# Patient Record
Sex: Female | Born: 1960 | Race: White | Hispanic: No | Marital: Married | Smoking: Never smoker
Health system: Southern US, Community
[De-identification: ages and names within clinical notes are randomized; demographics above are authoritative.]

## PROBLEM LIST (undated history)

## (undated) DIAGNOSIS — F419 Anxiety disorder, unspecified: Secondary | ICD-10-CM

## (undated) HISTORY — PX: REPLACEMENT TOTAL KNEE: SUR1224

## (undated) HISTORY — PX: TONSILLECTOMY: SUR1361

---

## 2001-03-06 ENCOUNTER — Encounter: Payer: Self-pay | Admitting: Obstetrics and Gynecology

## 2001-03-06 ENCOUNTER — Ambulatory Visit (HOSPITAL_COMMUNITY): Admission: RE | Admit: 2001-03-06 | Discharge: 2001-03-06 | Payer: Self-pay | Admitting: Obstetrics and Gynecology

## 2014-07-21 ENCOUNTER — Encounter (HOSPITAL_BASED_OUTPATIENT_CLINIC_OR_DEPARTMENT_OTHER): Payer: Self-pay

## 2014-07-21 ENCOUNTER — Emergency Department (HOSPITAL_BASED_OUTPATIENT_CLINIC_OR_DEPARTMENT_OTHER)
Admission: EM | Admit: 2014-07-21 | Discharge: 2014-07-22 | Disposition: A | Discharge status: Home / Self Care | Payer: BLUE CROSS/BLUE SHIELD | Attending: Emergency Medicine | Admitting: Emergency Medicine

## 2014-07-21 ENCOUNTER — Emergency Department (HOSPITAL_BASED_OUTPATIENT_CLINIC_OR_DEPARTMENT_OTHER): Payer: BLUE CROSS/BLUE SHIELD

## 2014-07-21 DIAGNOSIS — R51 Headache: Secondary | ICD-10-CM | POA: Diagnosis not present

## 2014-07-21 DIAGNOSIS — R0789 Other chest pain: Secondary | ICD-10-CM | POA: Diagnosis not present

## 2014-07-21 DIAGNOSIS — Z79899 Other long term (current) drug therapy: Secondary | ICD-10-CM | POA: Insufficient documentation

## 2014-07-21 DIAGNOSIS — F419 Anxiety disorder, unspecified: Secondary | ICD-10-CM | POA: Insufficient documentation

## 2014-07-21 DIAGNOSIS — R519 Headache, unspecified: Secondary | ICD-10-CM

## 2014-07-21 DIAGNOSIS — E876 Hypokalemia: Secondary | ICD-10-CM | POA: Diagnosis not present

## 2014-07-21 HISTORY — DX: Anxiety disorder, unspecified: F41.9

## 2014-07-21 LAB — CBC WITH DIFFERENTIAL/PLATELET
Basophils Absolute: 0.1 10*3/uL (ref 0.0–0.1)
Basophils Relative: 1 % (ref 0–1)
EOS ABS: 0.2 10*3/uL (ref 0.0–0.7)
Eosinophils Relative: 2 % (ref 0–5)
HCT: 40.5 % (ref 36.0–46.0)
Hemoglobin: 13.5 g/dL (ref 12.0–15.0)
LYMPHS ABS: 2.2 10*3/uL (ref 0.7–4.0)
Lymphocytes Relative: 31 % (ref 12–46)
MCH: 32.3 pg (ref 26.0–34.0)
MCHC: 33.3 g/dL (ref 30.0–36.0)
MCV: 96.9 fL (ref 78.0–100.0)
MONO ABS: 0.7 10*3/uL (ref 0.1–1.0)
Monocytes Relative: 10 % (ref 3–12)
Neutro Abs: 4.1 10*3/uL (ref 1.7–7.7)
Neutrophils Relative %: 56 % (ref 43–77)
Platelets: 233 10*3/uL (ref 150–400)
RBC: 4.18 MIL/uL (ref 3.87–5.11)
RDW: 12.2 % (ref 11.5–15.5)
WBC: 7.2 10*3/uL (ref 4.0–10.5)

## 2014-07-21 LAB — BASIC METABOLIC PANEL
ANION GAP: 6 (ref 5–15)
BUN: 12 mg/dL (ref 6–23)
CALCIUM: 9.2 mg/dL (ref 8.4–10.5)
CHLORIDE: 103 mmol/L (ref 96–112)
CO2: 27 mmol/L (ref 19–32)
Creatinine, Ser: 0.44 mg/dL — ABNORMAL LOW (ref 0.50–1.10)
GFR calc Af Amer: >90 mL/min (ref >90)
GLUCOSE: 109 mg/dL — AB (ref 70–99)
Potassium: 3.1 mmol/L — ABNORMAL LOW (ref 3.5–5.1)
Sodium: 136 mmol/L (ref 135–145)

## 2014-07-21 LAB — TROPONIN I: Troponin I: <0.03 ng/mL (ref <0.031)

## 2014-07-21 LAB — D-DIMER, QUANTITATIVE: D-Dimer, Quant: <0.27 ug/mL-FEU (ref 0.00–0.48)

## 2014-07-21 MED ORDER — KETOROLAC TROMETHAMINE 30 MG/ML IJ SOLN
30.0000 mg | Freq: Once | INTRAMUSCULAR | Status: AC
  Administered 2014-07-21: 30 mg via INTRAVENOUS
  Filled 2014-07-21: qty 1

## 2014-07-21 MED ORDER — SODIUM CHLORIDE 0.9 % IV BOLUS (SEPSIS)
1000.0000 mL | Freq: Once | INTRAVENOUS | Status: AC
  Administered 2014-07-21: 1000 mL via INTRAVENOUS

## 2014-07-21 MED ORDER — METOCLOPRAMIDE HCL 5 MG/ML IJ SOLN
10.0000 mg | Freq: Once | INTRAMUSCULAR | Status: AC
  Administered 2014-07-21: 10 mg via INTRAVENOUS
  Filled 2014-07-21: qty 2

## 2014-07-21 NOTE — ED Provider Notes (Signed)
CSN: [id number]     Arrival date & time 07/21/14  2057 History   First MD Initiated Contact with Patient 07/21/14 2130     Chief Complaint  Patient presents with  . Headache     (Consider location/radiation/quality/duration/timing/severity/associated sxs/prior Treatment) HPI  Regina Osborn is a [age] y.o. female with PMH of anxiety, HTN presenting with an on and off headache that she fears is related to high blood pressure as well as chest discomfort that is described as a tightness in her mid chest. This started at 11am this morning. She has this 3-4 times weekly and it is not worse with deep breathing, eating, exertion. Patient does endorse some shortness of breath associated with it. Mild nausea but no diaphoresis or vomiting. Patient has had no cardiac history and has been evaluated by a cardiologist. Patient denies history of diabetes, high cholesterol levels, satting cardiac death in the family. Patient without history of DVT, PE, malignancy, recent surgeries or trauma. No estrogen use. Patient with headache that developed gradually and is like other headaches he's had. Headache is throbbing and frontal. Worse at the end of the day. Worse with stress. Patient reports quite a bit of stress at work and at home.   Past Medical History  Diagnosis Date  . Anxiety    Past Surgical History  Procedure Laterality Date  . Tonsillectomy    . Replacement total knee     No family history on file. History  Substance Use Topics  . Smoking status: Never Smoker   . Smokeless tobacco: Not on file  . Alcohol Use: Yes     Comment: soical    OB History    No data available     Review of Systems 10 Systems reviewed and are negative for acute change except as noted in the HPI.    Allergies  Review of patient's allergies indicates no known allergies.  Home Medications   Prior to Admission medications   Medication Sig Start Date End Date Taking? Authorizing Provider  progesterone  (PROMETRIUM) 100 MG capsule Take 100 mg by mouth daily.   Yes Historical Provider, MD  venlafaxine (EFFEXOR) 25 MG tablet Take 25 mg by mouth 2 (two) times daily.   Yes Historical Provider, MD   BP 131/81 mmHg  Pulse 89  Temp(Src) 98.5 F (36.9 C) (Oral)  Resp 16  Ht 5\' 3"  (1.6 m)  Wt 117 lb (53.071 kg)  BMI 20.73 kg/m2  SpO2 99% Physical Exam  Constitutional: She appears well-developed and well-nourished. No distress.  HENT:  Head: Normocephalic and atraumatic.  Mouth/Throat: Oropharynx is clear and moist.  Eyes: Conjunctivae and EOM are normal. Pupils are equal, round, and reactive to light. Right eye exhibits no discharge. Left eye exhibits no discharge.  Neck: Normal range of motion. Neck supple. No JVD present.  No nuchal rigidity  Cardiovascular: Normal rate and regular rhythm.   No leg swelling or tenderness. Negative Homan's sign.  Pulmonary/Chest: Effort normal and breath sounds normal. No respiratory distress. She has no wheezes.  Abdominal: Soft. Bowel sounds are normal. She exhibits no distension. There is no tenderness.  Neurological: She is alert. No cranial nerve deficit. Coordination normal.  Speech is clear and goal oriented. Peripheral visual fields intact. Strength 5/5 in upper and lower extremities. Sensation intact. Intact rapid alternating movements, finger to nose, and heel to shin. Negative Romberg. No pronator drift. Normal gait.   Skin: Skin is warm and dry. She is not diaphoretic.  Nursing note  and vitals reviewed.   ED Course  Procedures (including critical care time) Labs Review Labs Reviewed  BASIC METABOLIC PANEL - Abnormal; Notable for the following:    Potassium 3.1 (*)    Glucose, Bld 109 (*)    Creatinine, Ser 0.44 (*)    All other components within normal limits  CBC WITH DIFFERENTIAL/PLATELET  TROPONIN I  D-DIMER, QUANTITATIVE    Imaging Review Dg Chest 2 View  07/21/2014   CLINICAL DATA:  Acute onset of headache, nausea and  shortness of breath. Initial encounter.  EXAM: CHEST  2 VIEW  COMPARISON:  None.  FINDINGS: The lungs are well-aerated and clear. There is no evidence of focal opacification, pleural effusion or pneumothorax.  The heart is normal in size; the mediastinal contour is within normal limits. No acute osseous abnormalities are seen.  IMPRESSION: No acute cardiopulmonary process seen.   Electronically Signed   By: Roanna Raider M.D.   On: 07/21/2014 23:24     EKG Interpretation   Date/Time:  Monday July 21 2014 23:52:35 EST Ventricular Rate:  79 PR Interval:  172 QRS Duration: 76 QT Interval:  418 QTC Calculation: 479 R Axis:   61 Text Interpretation:  Normal sinus rhythm Confirmed by [facility]  MD,  APRIL ([id number]) on 07/21/2014 11:53:41 PM      MDM   Final diagnoses:  Atypical chest pain  Acute nonintractable headache, unspecified headache type  Hypokalemia   Chest pain is not likely of cardiac or pulmonary etiology d/t presentation, low risk Well's with negative D-dimer, low risk HEART score. VSS, no JVD or new murmur, RRR, breath sounds equal bilaterally, no peripheral edema, EKG without acute abnormalities, negative troponin with over 8 hours of constant pain, and negative CXR. Patient's chest pain occurs 2-3 times per week and is affiliated with stressors. Patient found to have hyperkalemia which was treated orally in ED. Pt has been advised to return to the ED if CP becomes exertional, associated with diaphoresis or nausea, radiates to left jaw/arm, worsens or becomes concerning in any way. Follow-up with PCP. Patient with headache as well. Is like other headaches she's had before been gradual in onset. Improved while in ED. Normal neurological exam. I doubt subarachnoid, intracranial hemorrhage, meningitis. Pt appears reliable for follow up and is agreeable to discharge.   Discussed return precautions with patient. Discussed all results and patient verbalizes understanding and agrees  with plan.  This is a shared patient. This patient was discussed with the physician who saw and evaluated the patient and agrees with the plan.    Louann Sjogren, PA-C 07/22/14 0044  Richardean Canal, MD 07/24/14 [phone]

## 2014-07-21 NOTE — ED Notes (Signed)
Pt states took a valerian root 1hr ago with some relief

## 2014-07-21 NOTE — ED Notes (Signed)
Pt states battling high b/p for past 6months; states having headaches and nausea for past 5days, states her father had a stroke in his sleep so wanted her pressure checked.

## 2014-07-22 MED ORDER — POTASSIUM CHLORIDE CRYS ER 20 MEQ PO TBCR
40.0000 meq | EXTENDED_RELEASE_TABLET | Freq: Once | ORAL | Status: AC
  Administered 2014-07-22: 40 meq via ORAL
  Filled 2014-07-22: qty 2

## 2014-07-22 NOTE — Discharge Instructions (Signed)
Return to the emergency room with worsening of symptoms, new symptoms or with symptoms that are concerning , especially chest pain that feels like a pressure, spreads to left arm or jaw, worse with exertion, associated with nausea, vomiting, shortness of breath and/or sweating OR severe worsening of headache, visual or speech changes, weakness in face, arms or legs.  Please call your doctor for a followup appointment within 24-48 hours. When you talk to your doctor please let them know that you were seen in the emergency department and have them acquire all of your records so that they can discuss the findings with you and formulate a treatment plan to fully care for your new and ongoing problems.  Chest Pain (Nonspecific) It is often hard to give a specific diagnosis for the cause of chest pain. There is always a chance that your pain could be related to something serious, such as a heart attack or a blood clot in the lungs. You need to follow up with your health care provider for further evaluation. CAUSES   Heartburn.  Pneumonia or bronchitis.  Anxiety or stress.  Inflammation around your heart (pericarditis) or lung (pleuritis or pleurisy).  A blood clot in the lung.  A collapsed lung (pneumothorax). It can develop suddenly on its own (spontaneous pneumothorax) or from trauma to the chest.  Shingles infection (herpes zoster virus). The chest wall is composed of bones, muscles, and cartilage. Any of these can be the source of the pain.  The bones can be bruised by injury.  The muscles or cartilage can be strained by coughing or overwork.  The cartilage can be affected by inflammation and become sore (costochondritis). DIAGNOSIS  Lab tests or other studies may be needed to find the cause of your pain. Your health care provider may have you take a test called an ambulatory electrocardiogram (ECG). An ECG records your heartbeat patterns over a 24-hour period. You may also have other  tests, such as:  Transthoracic echocardiogram (TTE). During echocardiography, sound waves are used to evaluate how blood flows through your heart.  Transesophageal echocardiogram (TEE).  Cardiac monitoring. This allows your health care provider to monitor your heart rate and rhythm in real time.  Holter monitor. This is a portable device that records your heartbeat and can help diagnose heart arrhythmias. It allows your health care provider to track your heart activity for several days, if needed.  Stress tests by exercise or by giving medicine that makes the heart beat faster. TREATMENT   Treatment depends on what may be causing your chest pain. Treatment may include:  Acid blockers for heartburn.  Anti-inflammatory medicine.  Pain medicine for inflammatory conditions.  Antibiotics if an infection is present.  You may be advised to change lifestyle habits. This includes stopping smoking and avoiding alcohol, caffeine, and chocolate.  You may be advised to keep your head raised (elevated) when sleeping. This reduces the chance of acid going backward from your stomach into your esophagus. Most of the time, nonspecific chest pain will improve within 2-3 days with rest and mild pain medicine.  HOME CARE INSTRUCTIONS   If antibiotics were prescribed, take them as directed. Finish them even if you start to feel better.  For the next few days, avoid physical activities that bring on chest pain. Continue physical activities as directed.  Do not use any tobacco products, including cigarettes, chewing tobacco, or electronic cigarettes.  Avoid drinking alcohol.  Only take medicine as directed by your health care provider.  Follow your health care provider's suggestions for further testing if your chest pain does not go away.  Keep any follow-up appointments you made. If you do not go to an appointment, you could develop lasting (chronic) problems with pain. If there is any problem  keeping an appointment, call to reschedule. SEEK MEDICAL CARE IF:   Your chest pain does not go away, even after treatment.  You have a rash with blisters on your chest.  You have a fever. SEEK IMMEDIATE MEDICAL CARE IF:   You have increased chest pain or pain that spreads to your arm, neck, jaw, back, or abdomen.  You have shortness of breath.  You have an increasing cough, or you cough up blood.  You have severe back or abdominal pain.  You feel nauseous or vomit.  You have severe weakness.  You faint.  You have chills. This is an emergency. Do not wait to see if the pain will go away. Get medical help at once. Call your local emergency services (911 in [place] Do not drive yourself to the hospital. MAKE SURE YOU:   Understand these instructions.  Will watch your condition.  Will get help right away if you are not doing well or get worse. Document Released: 03/23/2005 Document Revised: 06/18/2013 Document Reviewed: 01/17/2008 [facility] Patient Information 2015 [place], [place]. This information is not intended to replace advice given to you by your health care provider. Make sure you discuss any questions you have with your health care provider.  General Headache Without Cause A headache is pain or discomfort felt around the head or neck area. The specific cause of a headache may not be found. There are many causes and types of headaches. A few common ones are:  Tension headaches.  Migraine headaches.  Cluster headaches.  Chronic daily headaches. HOME CARE INSTRUCTIONS   Keep all follow-up appointments with your caregiver or any specialist referral.  Only take over-the-counter or prescription medicines for pain or discomfort as directed by your caregiver.  Lie down in a dark, quiet room when you have a headache.  Keep a headache journal to find out what may trigger your migraine headaches. For example, write down:  What you eat and drink.  How much sleep you  get.  Any change to your diet or medicines.  Try massage or other relaxation techniques.  Put ice packs or heat on the head and neck. Use these 3 to 4 times per day for 15 to 20 minutes each time, or as needed.  Limit stress.  Sit up straight, and do not tense your muscles.  Quit smoking if you smoke.  Limit alcohol use.  Decrease the amount of caffeine you drink, or stop drinking caffeine.  Eat and sleep on a regular schedule.  Get 7 to 9 hours of sleep, or as recommended by your caregiver.  Keep lights dim if bright lights bother you and make your headaches worse. SEEK MEDICAL CARE IF:   You have problems with the medicines you were prescribed.  Your medicines are not working.  You have a change from the usual headache.  You have nausea or vomiting. SEEK IMMEDIATE MEDICAL CARE IF:   Your headache becomes severe.  You have a fever.  You have a stiff neck.  You have loss of vision.  You have muscular weakness or loss of muscle control.  You start losing your balance or have trouble walking.  You feel faint or pass out.  You have severe symptoms that are  different from your first symptoms. MAKE SURE YOU:   Understand these instructions.  Will watch your condition.  Will get help right away if you are not doing well or get worse. Document Released: 06/13/2005 Document Revised: 09/05/2011 Document Reviewed: 06/29/2011 [facility] Patient Information 2015 [place], [place]. This information is not intended to replace advice given to you by your health care provider. Make sure you discuss any questions you have with your health care provider.

## 2014-07-22 NOTE — ED Notes (Signed)
PA at bedside.

## 2016-02-25 IMAGING — CR DG CHEST 2V
2 series · 2 of 2 positions shown · non-contrast
Comparison: None.

CLINICAL DATA: Acute onset of headache, nausea and shortness of
breath. Initial encounter.

EXAM:
CHEST  2 VIEW

[w chest pa]
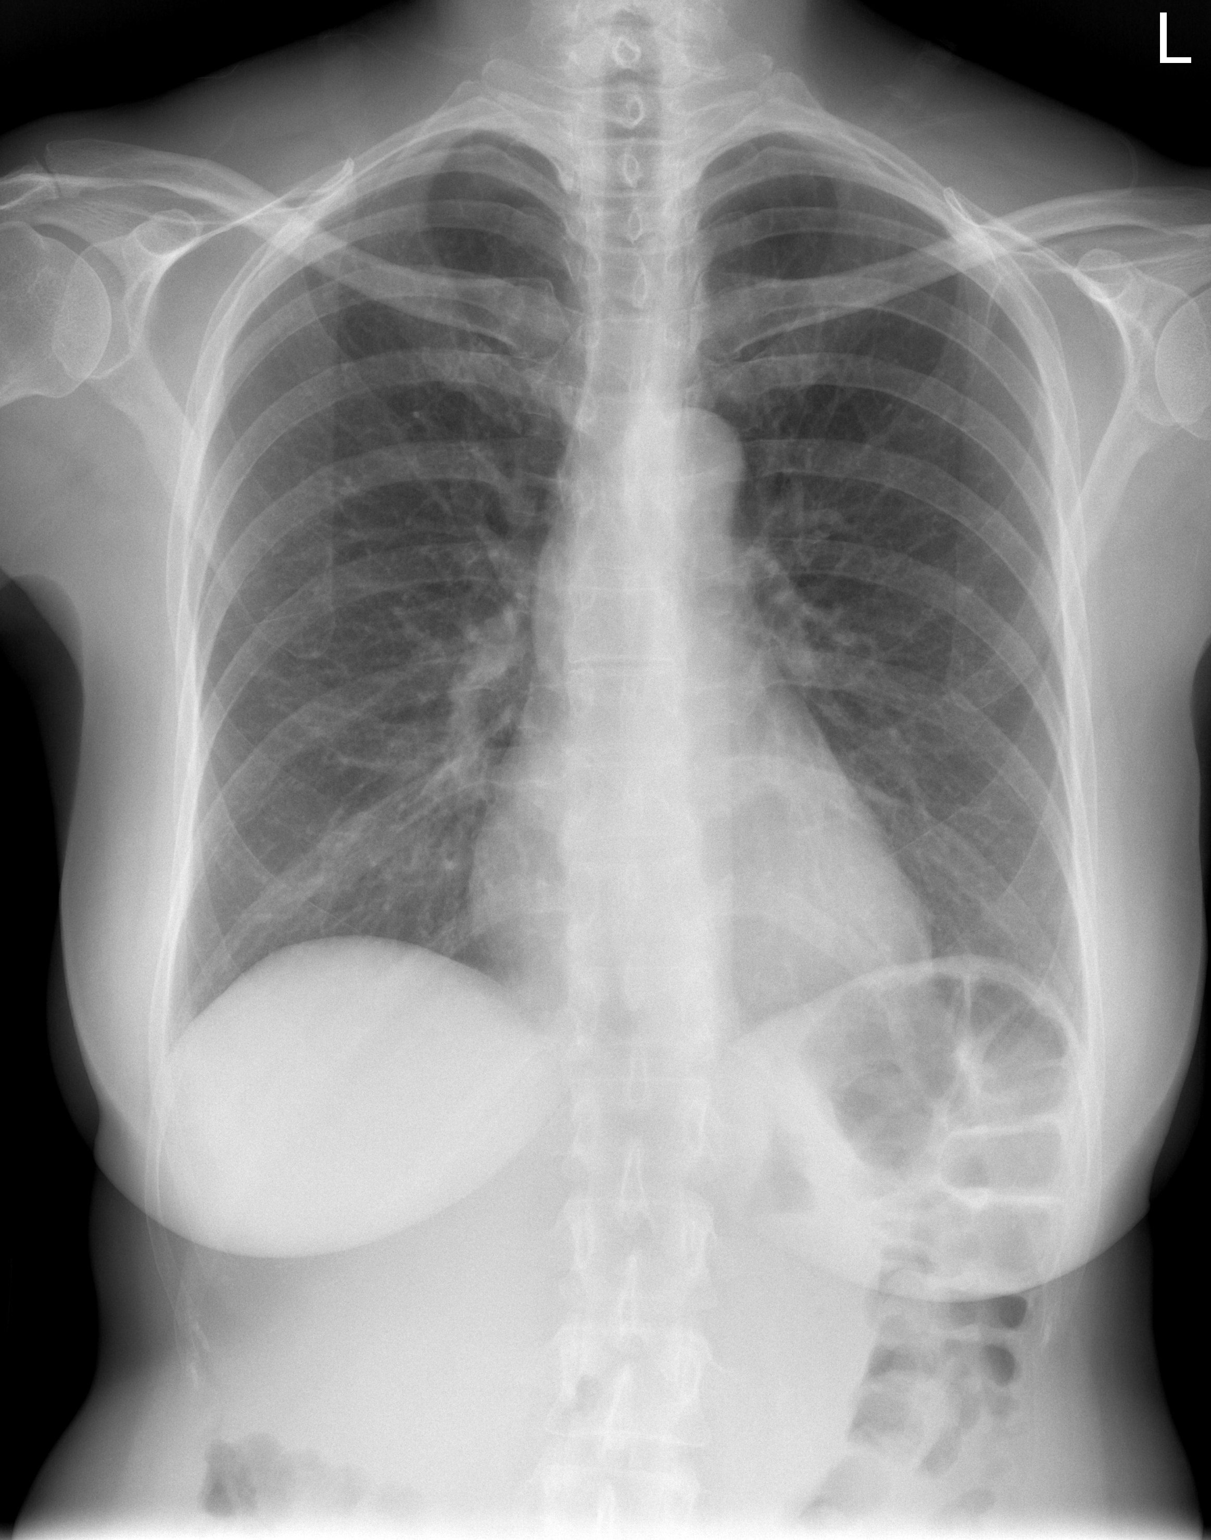

[w chest lat]
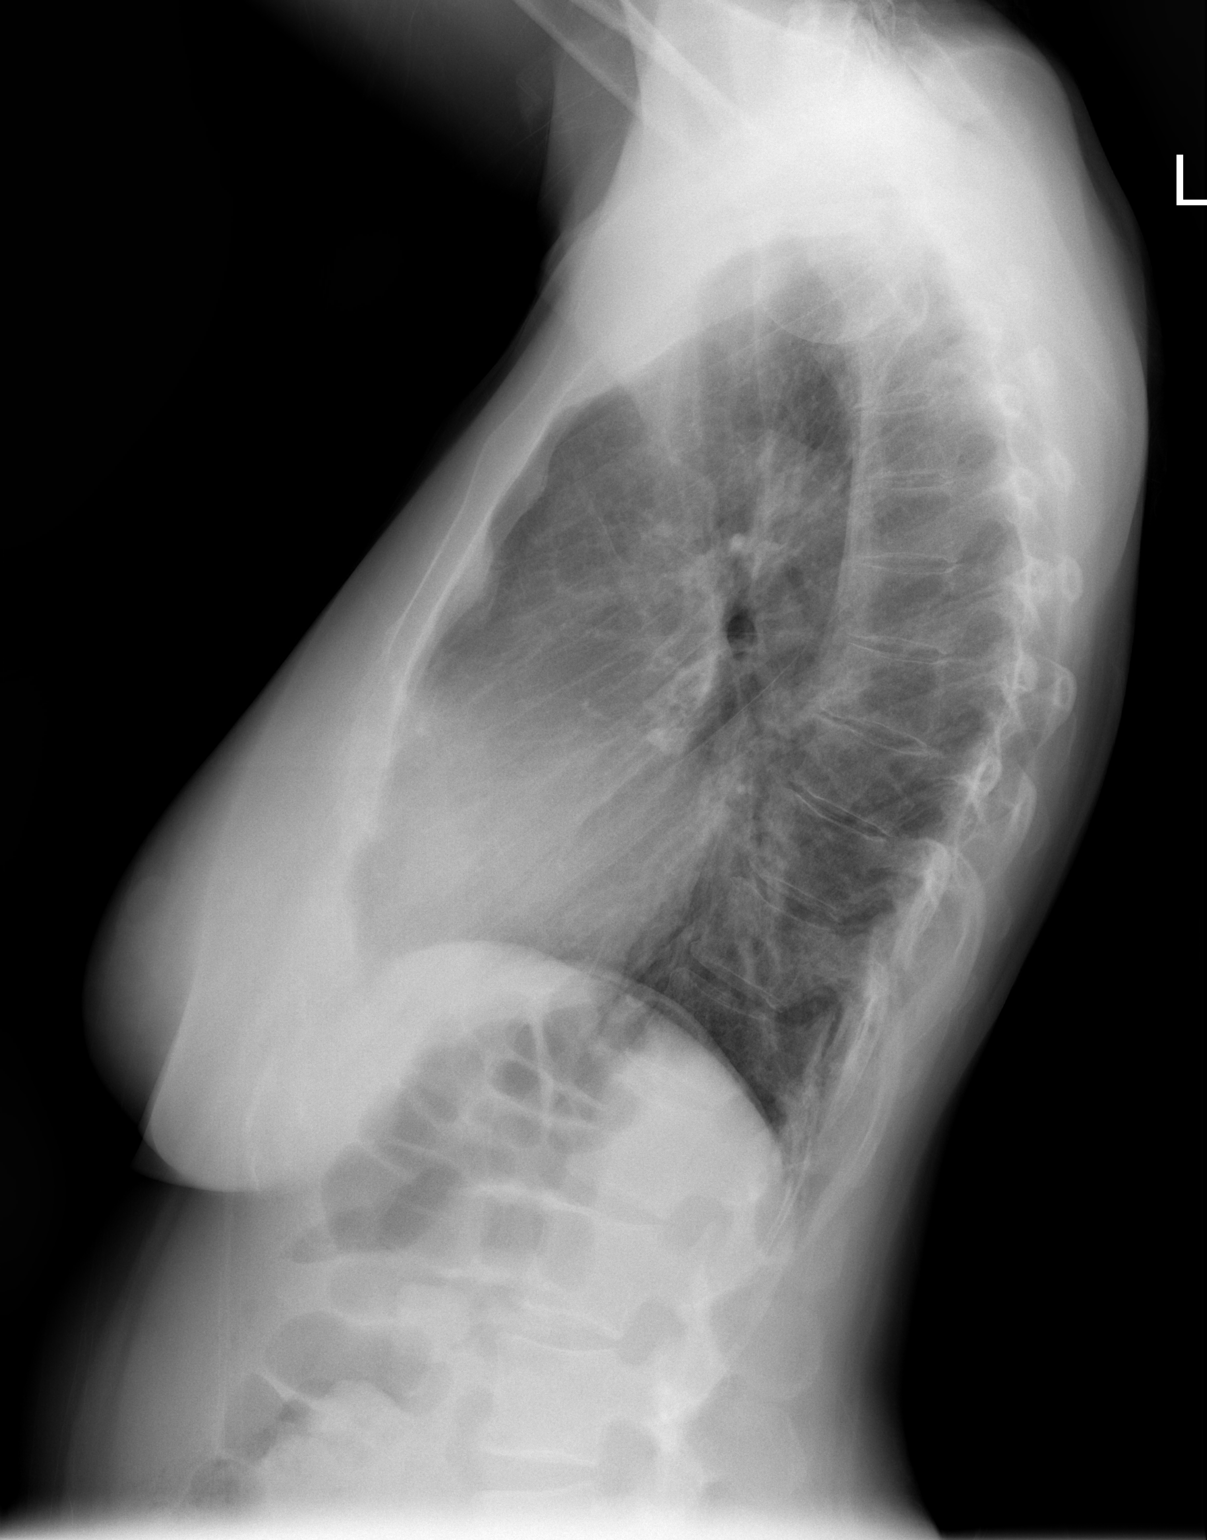

[2 of 2 positions shown; findings below may reference images not displayed]

FINDINGS: The lungs are well-aerated and clear. There is no evidence of focal
opacification, pleural effusion or pneumothorax.

The heart is normal in size; the mediastinal contour is within
normal limits. No acute osseous abnormalities are seen.
IMPRESSION: No acute cardiopulmonary process seen.

## 2017-10-28 ENCOUNTER — Encounter (HOSPITAL_BASED_OUTPATIENT_CLINIC_OR_DEPARTMENT_OTHER): Payer: Self-pay | Admitting: Emergency Medicine

## 2017-10-28 ENCOUNTER — Other Ambulatory Visit: Payer: Self-pay

## 2017-10-28 ENCOUNTER — Emergency Department (HOSPITAL_BASED_OUTPATIENT_CLINIC_OR_DEPARTMENT_OTHER)
Admission: EM | Admit: 2017-10-28 | Discharge: 2017-10-28 | Disposition: A | Discharge status: Home / Self Care | Payer: BLUE CROSS/BLUE SHIELD | Attending: Emergency Medicine | Admitting: Emergency Medicine

## 2017-10-28 DIAGNOSIS — R300 Dysuria: Secondary | ICD-10-CM | POA: Diagnosis present

## 2017-10-28 DIAGNOSIS — N12 Tubulo-interstitial nephritis, not specified as acute or chronic: Secondary | ICD-10-CM | POA: Insufficient documentation

## 2017-10-28 DIAGNOSIS — Z96659 Presence of unspecified artificial knee joint: Secondary | ICD-10-CM | POA: Insufficient documentation

## 2017-10-28 DIAGNOSIS — Z79899 Other long term (current) drug therapy: Secondary | ICD-10-CM | POA: Diagnosis not present

## 2017-10-28 LAB — CBC WITH DIFFERENTIAL/PLATELET
Basophils Absolute: 0 10*3/uL (ref 0.0–0.1)
Basophils Relative: 0 %
Eosinophils Absolute: 0 10*3/uL (ref 0.0–0.7)
Eosinophils Relative: 0 %
HEMATOCRIT: 37.6 % (ref 36.0–46.0)
Hemoglobin: 13.1 g/dL (ref 12.0–15.0)
Lymphocytes Relative: 5 %
Lymphs Abs: 0.6 10*3/uL — ABNORMAL LOW (ref 0.7–4.0)
MCH: 33 pg (ref 26.0–34.0)
MCHC: 34.8 g/dL (ref 30.0–36.0)
MCV: 94.7 fL (ref 78.0–100.0)
MONOS PCT: 11 %
Monocytes Absolute: 1.3 10*3/uL — ABNORMAL HIGH (ref 0.1–1.0)
NEUTROS ABS: 10.7 10*3/uL — AB (ref 1.7–7.7)
Neutrophils Relative %: 84 %
PLATELETS: 194 10*3/uL (ref 150–400)
RBC: 3.97 MIL/uL (ref 3.87–5.11)
RDW: 12.8 % (ref 11.5–15.5)
WBC: 12.7 10*3/uL — ABNORMAL HIGH (ref 4.0–10.5)

## 2017-10-28 LAB — URINALYSIS, MICROSCOPIC (REFLEX)

## 2017-10-28 LAB — URINALYSIS, ROUTINE W REFLEX MICROSCOPIC
Bilirubin Urine: NEGATIVE
GLUCOSE, UA: NEGATIVE mg/dL
Ketones, ur: 15 mg/dL — AB
Leukocytes, UA: NEGATIVE
Nitrite: NEGATIVE
PH: 6.5 (ref 5.0–8.0)
Protein, ur: NEGATIVE mg/dL

## 2017-10-28 LAB — COMPREHENSIVE METABOLIC PANEL
ALBUMIN: 3.8 g/dL (ref 3.5–5.0)
ALT: 16 U/L (ref 14–54)
AST: 19 U/L (ref 15–41)
Alkaline Phosphatase: 56 U/L (ref 38–126)
Anion gap: 11 (ref 5–15)
BUN: 8 mg/dL (ref 6–20)
CHLORIDE: 104 mmol/L (ref 101–111)
CO2: 20 mmol/L — AB (ref 22–32)
Calcium: 8.5 mg/dL — ABNORMAL LOW (ref 8.9–10.3)
Creatinine, Ser: 0.49 mg/dL (ref 0.44–1.00)
GFR calc Af Amer: >60 mL/min (ref >60)
GLUCOSE: 141 mg/dL — AB (ref 65–99)
POTASSIUM: 3.1 mmol/L — AB (ref 3.5–5.1)
SODIUM: 135 mmol/L (ref 135–145)
Total Bilirubin: 0.9 mg/dL (ref 0.3–1.2)
Total Protein: 6.7 g/dL (ref 6.5–8.1)

## 2017-10-28 MED ORDER — SODIUM CHLORIDE 0.9 % IV BOLUS
1000.0000 mL | Freq: Once | INTRAVENOUS | Status: AC
  Administered 2017-10-28: 1000 mL via INTRAVENOUS

## 2017-10-28 MED ORDER — ACETAMINOPHEN 325 MG PO TABS
650.0000 mg | ORAL_TABLET | Freq: Once | ORAL | Status: AC
  Administered 2017-10-28: 650 mg via ORAL
  Filled 2017-10-28: qty 2

## 2017-10-28 MED ORDER — CEPHALEXIN 500 MG PO CAPS: 500.0000 mg | ORAL_CAPSULE | Freq: Four times a day (QID) | ORAL | 0 refills | Status: DC

## 2017-10-28 MED ORDER — SODIUM CHLORIDE 0.9 % IV SOLN
1.0000 g | Freq: Once | INTRAVENOUS | Status: AC
  Administered 2017-10-28: 1 g via INTRAVENOUS
  Filled 2017-10-28: qty 10

## 2017-10-28 MED ORDER — ONDANSETRON HCL 4 MG/2ML IJ SOLN
4.0000 mg | Freq: Once | INTRAMUSCULAR | Status: AC
  Administered 2017-10-28: 4 mg via INTRAVENOUS
  Filled 2017-10-28: qty 2

## 2017-10-28 NOTE — ED Provider Notes (Signed)
[facility] EMERGENCY DEPARTMENT Provider Note   CSN: [id number] Arrival date & time: 10/28/17  1037     History   Chief Complaint Chief Complaint  Patient presents with  . Back Pain    HPI Regina Osborn is a 57 y.o. female.  Patient is a 57 year old female with no significant medical problems who is presenting today with persistent dysuria, now fever, nausea and back pain starting in the last 2 days.  Patient states she has had dysuria for the last 6 days.  She saw her doctor on Thursday and based on her symptoms she was started on Macrobid.  However that night she developed fever and back pain.  That is been persistent as well as body aches.  She spoke with her doctor today who recommended she come here for further evaluation.  The history is provided by the patient.  Dysuria   This is a new problem. Episode onset: 6 days ago. The problem occurs every urination. The problem has been gradually worsening. The quality of the pain is described as burning, shooting and aching. The pain is at a severity of 4/10. The pain is moderate. The maximum temperature recorded prior to her arrival was 101 to 101.9 F. The fever has been present for 1 to 2 days. There is no history of pyelonephritis. Associated symptoms include chills, sweats, nausea, frequency, urgency and flank pain. Pertinent negatives include no vomiting and no discharge. She has tried antibiotics for the symptoms. Her past medical history does not include kidney stones, urological procedure or recurrent UTIs.    Past Medical History:  Diagnosis Date  . Anxiety     There are no active problems to display for this patient.   Past Surgical History:  Procedure Laterality Date  . REPLACEMENT TOTAL KNEE    . TONSILLECTOMY       OB History   None      Home Medications    Prior to Admission medications   Medication Sig Start Date End Date Taking? Authorizing Provider  estradiol (ESTRACE) 1 MG tablet Take 1 mg  by mouth daily.   Yes [provider]  thyroid (ARMOUR) 195 MG tablet Take 195 mg by mouth daily.   Yes [provider]  progesterone (PROMETRIUM) 100 MG capsule Take 100 mg by mouth daily.    [provider]  venlafaxine (EFFEXOR) 25 MG tablet Take 25 mg by mouth 2 (two) times daily.    [provider]    Family History History reviewed. No pertinent family history.  Social History Social History   Tobacco Use  . Smoking status: Never Smoker  . Smokeless tobacco: Never Used  Substance Use Topics  . Alcohol use: Yes    Comment: soical   . Drug use: Not on file     Allergies   Sulfa antibiotics   Review of Systems Review of Systems  Constitutional: Positive for chills.  Gastrointestinal: Positive for nausea. Negative for vomiting.  Genitourinary: Positive for dysuria, flank pain, frequency and urgency.  All other systems reviewed and are negative.    Physical Exam Updated Vital Signs BP (!) 158/84 (BP Location: Right Arm)   Pulse 98   Temp 99.9 F (37.7 C) (Oral)   Resp 18   Ht  (1.6 m)   Wt 56.7 kg (125 lb)   SpO2 100%   BMI 22.14 kg/m   Physical Exam  Constitutional: She is oriented to person, place, and time. She appears well-developed and  well-nourished. No distress.  HENT:  Head: Normocephalic and atraumatic.  Mouth/Throat: Oropharynx is clear and moist. Mucous membranes are dry.  Eyes: Pupils are equal, round, and reactive to light. Conjunctivae and EOM are normal.  Neck: Normal range of motion. Neck supple.  Cardiovascular: Normal rate, regular rhythm and intact distal pulses.  No murmur heard. Pulmonary/Chest: Effort normal and breath sounds normal. No respiratory distress. She has no wheezes. She has no rales.  Abdominal: Soft. She exhibits no distension. There is tenderness in the suprapubic area. There is CVA tenderness. There is no rebound and no guarding.  Musculoskeletal: Normal range of motion. She  exhibits no edema or tenderness.  Neurological: She is alert and oriented to person, place, and time.  Skin: Skin is warm and dry. No rash noted. No erythema.  Psychiatric: She has a normal mood and affect. Her behavior is normal.  Nursing note and vitals reviewed.    ED Treatments / Results  Labs (all labs ordered are listed, but only abnormal results are displayed) Labs Reviewed  CBC WITH DIFFERENTIAL/PLATELET - Abnormal; Notable for the following components:      Result Value   WBC 12.7 (*)    Neutro Abs 10.7 (*)    Lymphs Abs 0.6 (*)    Monocytes Absolute 1.3 (*)    All other components within normal limits  COMPREHENSIVE METABOLIC PANEL - Abnormal; Notable for the following components:   Potassium 3.1 (*)    CO2 20 (*)    Glucose, Bld 141 (*)    Calcium 8.5 (*)    All other components within normal limits  URINALYSIS, ROUTINE W REFLEX MICROSCOPIC - Abnormal; Notable for the following components:   Specific Gravity, Urine <1.005 (*)    Hgb urine dipstick TRACE (*)    Ketones, ur 15 (*)    All other components within normal limits  URINALYSIS, MICROSCOPIC (REFLEX) - Abnormal; Notable for the following components:   Bacteria, UA FEW (*)    All other components within normal limits  URINE CULTURE    EKG None  Radiology No results found.  Procedures Procedures (including critical care time)  Medications Ordered in ED Medications  sodium chloride 0.9 % bolus 1,000 mL (has no administration in time range)  ondansetron (ZOFRAN) injection 4 mg (has no administration in time range)     Initial Impression / Assessment and Plan / ED Course  I have reviewed the triage vital signs and the nursing notes.  Pertinent labs & imaging results that were available during my care of the patient were reviewed by me and considered in my medical decision making (see chart for details).     Patient presenting with symptoms concerning for pyelonephritis.  Patient had UTI type  symptoms 6 days ago and was started on Macrobid 2 days ago but symptoms are progressing.  She is having fever, flank pain, nausea and dysuria.  She does not have frequent UTIs has never had a kidney stone and does not have any type of procedure concerning for resistant organism.  Low suspicion for kidney stone, dissection, vaginal or abdominal pathology at this time.  Patient given IV fluids, Zofran and Tylenol.  CBC, CMP, UA and culture pending  12:34 PM Abs with mild leukocytosis of 12.7, normal hemoglobin, renal function is intact and mild hypokalemia of 3.1.  Patient's urine without significant findings for infection however Macrobid may have cleared some of the UTI however given her history concern for [place].  Patient given a dose of  Rocephin here.  Will be told to stop Macrobid and start Keflex.  Final Clinical Impressions(s) / ED Diagnoses   Final diagnoses:  Pyelonephritis    ED Discharge Orders        Ordered    cephALEXin (KEFLEX) 500 MG capsule  4 times daily     10/28/17 1311       Gwyneth Sprout, MD 10/28/17 1311

## 2017-10-28 NOTE — ED Triage Notes (Signed)
Patient states that she has had back pain for the last 2 days - the patient was dx with UTI and started on Macrobid - patient continues to have increased fever and chills

## 2017-10-28 NOTE — Discharge Instructions (Signed)
Drink plenty of fluids, take Tylenol or ibuprofen for pain and fever.  Take the antibiotic starting tomorrow.

## 2017-10-28 NOTE — ED Notes (Signed)
Pt given Rx x 1 for keflex. Ambulatory to d/c window with steady gait.

## 2017-10-29 LAB — URINE CULTURE: Culture: 10000 — AB

## 2017-10-30 ENCOUNTER — Telehealth: Payer: Self-pay | Admitting: *Deleted

## 2017-10-30 NOTE — Telephone Encounter (Signed)
Post ED Visit - Positive Culture Follow-up  Culture report reviewed by antimicrobial stewardship pharmacist:   Enzo Bi, Pharm.D.  Celedonio Miyamoto, Pharm.D., BCPS AQ-ID  Garvin Fila, Pharm.D., BCPS  Georgina Pillion, Pharm.D., BCPS  [place], [address]D., BCPS, AAHIVP  Estella Husk, Pharm.D., BCPS, AAHIVP  Lysle Pearl, PharmD, BCPS  Sherlynn Carbon, PharmD  Pollyann Samples, PharmD, BCPS Sharin Mons, PharmD  Positive urine culture Treated with Cephalexin, organism sensitive to the same and no further patient follow-up is required at this time.  Virl Axe [facility] 10/30/2017, 10:30 AM

## 2018-05-13 ENCOUNTER — Emergency Department (HOSPITAL_BASED_OUTPATIENT_CLINIC_OR_DEPARTMENT_OTHER)
Admission: EM | Admit: 2018-05-13 | Discharge: 2018-05-13 | Disposition: A | Discharge status: Home / Self Care | Payer: BLUE CROSS/BLUE SHIELD | Attending: Emergency Medicine | Admitting: Emergency Medicine

## 2018-05-13 ENCOUNTER — Other Ambulatory Visit: Payer: Self-pay

## 2018-05-13 ENCOUNTER — Encounter (HOSPITAL_BASED_OUTPATIENT_CLINIC_OR_DEPARTMENT_OTHER): Payer: Self-pay | Admitting: Emergency Medicine

## 2018-05-13 DIAGNOSIS — Z4800 Encounter for change or removal of nonsurgical wound dressing: Secondary | ICD-10-CM

## 2018-05-13 DIAGNOSIS — Z4801 Encounter for change or removal of surgical wound dressing: Secondary | ICD-10-CM | POA: Insufficient documentation

## 2018-05-13 DIAGNOSIS — Z4889 Encounter for other specified surgical aftercare: Secondary | ICD-10-CM

## 2018-05-13 DIAGNOSIS — Z79899 Other long term (current) drug therapy: Secondary | ICD-10-CM | POA: Diagnosis not present

## 2018-05-13 NOTE — Discharge Instructions (Addendum)
Until you are told you can by your surgeon please do not get your wound wet.  Please keep your appointment with them tomorrow.  You may continue to have some bruising and drainage around your wound, however right now it does not look infected.  Please keep an eye on it to make sure it does not become very red.

## 2018-05-13 NOTE — ED Triage Notes (Signed)
Pt reports that she had left knee surgery 10/24. Pt fell on Tuesday, pt seen in ED where she had the surgical site sutured again. Pt reports bleeding through stitches.

## 2018-05-13 NOTE — ED Provider Notes (Addendum)
[facility] EMERGENCY DEPARTMENT Provider Note   CSN: [id number] Arrival date & time: 05/13/18  1039     History   Chief Complaint Chief Complaint  Patient presents with  . Fall  . Post-op Problem    HPI Posey R Werk is a 57 y.o. female who presents today for evaluation of postsurgical wound.  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  On Tuesday she fell and was seen in a different emergency room where her laceration was sutured and she had a dressing placed.  Her fall was mechanical.  She has an appointment tomorrow with her surgeon.  She has been evaluated since the fall, has had an MRI, x-rays, and additional tests obtained.  She denies any injuries other than to her leg from the fall.  No fevers, no nausea vomiting or diarrhea.  Her pain is significantly improved from the fall.  Her only concern today is that she had discharge on her dressing, and wishes for a new dressing to be put on.    Dressing that was on has serosanguineous drainage without evidence of purulent drainage.  HPI  Past Medical History:  Diagnosis Date  . Anxiety     There are no active problems to display for this patient.   Past Surgical History:  Procedure Laterality Date  . REPLACEMENT TOTAL KNEE    . TONSILLECTOMY       OB History   None      Home Medications    Prior to Admission medications   Medication Sig Start Date End Date Taking? Authorizing Provider  cephALEXin (KEFLEX) 500 MG capsule Take 1 capsule (500 mg total) by mouth 4 (four) times daily. 10/28/17   Gwyneth Sprout, MD  estradiol (ESTRACE) 1 MG tablet Take 1 mg by mouth daily.    [provider]  progesterone (PROMETRIUM) 100 MG capsule Take 100 mg by mouth daily.    [provider]  thyroid (ARMOUR) 195 MG tablet Take 195 mg by mouth daily.    [provider]  venlafaxine (EFFEXOR) 25 MG tablet Take 25 mg by mouth 2 (two) times daily.    [provider]    Family  History No family history on file.  Social History Social History   Tobacco Use  . Smoking status: Never Smoker  . Smokeless tobacco: Never Used  Substance Use Topics  . Alcohol use: Yes    Comment: soical   . Drug use: Never     Allergies   Sulfa antibiotics   Review of Systems Review of Systems  Constitutional: Negative for chills and fever.  Musculoskeletal: Negative for arthralgias.  Skin: Positive for wound.  Neurological: Negative for headaches.  All other systems reviewed and are negative.    Physical Exam Updated Vital Signs BP (!) 163/65 (BP Location: Right Arm)   Pulse 88   Temp 98.9 F (37.2 C) (Oral)   Resp 14   Ht 5\' 3"  (1.6 m)   Wt 58.1 kg   SpO2 100%   BMI 22.67 kg/m   Physical Exam  Constitutional: She appears well-developed and well-nourished. No distress.  HENT:  Head: Normocephalic and atraumatic.  Eyes: Conjunctivae are normal. Right eye exhibits no discharge. Left eye exhibits no discharge. No scleral icterus.  Neck: Normal range of motion.  Cardiovascular: Normal rate, regular rhythm and intact distal pulses.  2+ DP/PT pulses bilaterally.  Pulmonary/Chest: Effort normal. No stridor. No respiratory distress.  Abdominal: She exhibits no distension.  Musculoskeletal:  She exhibits no edema or deformity.  Neurological: She is alert. She exhibits normal muscle tone.  Sensation intact to bilateral lower extremities.  Skin: Skin is warm and dry. She is not diaphoretic.  Please see clinical image.  Approximately 8 cm surgical scar on the left anterior knee.  Sutures are in place, no abnormal erythema, induration, fluctuance, or swelling.  There is no obvious drainage from the wound.  Psychiatric: She has a normal mood and affect. Her behavior is normal.  Nursing note and vitals reviewed.      ED Treatments / Results  Labs (all labs ordered are listed, but only abnormal results are displayed) Labs Reviewed - No data to  display  EKG None  Radiology No results found.  Procedures Procedures (including critical care time)  Medications Ordered in ED Medications - No data to display   Initial Impression / Assessment and Plan / ED Course  I have reviewed the triage vital signs and the nursing notes.  Pertinent labs & imaging results that were available during my care of the patient were reviewed by me and considered in my medical decision making (see chart for details).    Patient presents today for a dressing change.  She is postop from a knee replacement on 10/24, had a mechanical fall on Tuesday where the surgical site was sutured, she has since seen her surgeon and has an appointment with them tomorrow.  She denies any fevers.  She reports her primary concern was that she had discharge on her dressing that had been in place for 3 days.  Upon evaluation no evidence of infection, no abnormal erythema, induration.  Wound is clean dry and intact.  She is given a new dressing with antibiotic ointment.  She denies any other complaints or concerns other than needing a new dressing placed.  She has appropriate outpatient follow-up with her surgeon tomorrow.  She was informed that her blood pressure is elevated, she says that it is normally not elevated and attributes it to anxiety.  She is aware of the need to follow-up for recheck of her blood pressure in the next week.  Return precautions were discussed with patient who states their understanding.  At the time of discharge patient denied any unaddressed complaints or concerns.  Patient is agreeable for discharge home.   Final Clinical Impressions(s) / ED Diagnoses   Final diagnoses:  Encounter for post surgical wound check  Dressing change    ED Discharge Orders    None       Cristina GongHammond, Annalei Friesz W, PA-C 05/13/18 1205    Cristina GongHammond, Kaytlin Burklow W, PA-C 05/13/18 1205    Arby BarrettePfeiffer, Marcy, MD 05/16/18 [phone]

## 2019-06-11 ENCOUNTER — Emergency Department (INDEPENDENT_AMBULATORY_CARE_PROVIDER_SITE_OTHER)
Admission: EM | Admit: 2019-06-11 | Discharge: 2019-06-11 | Disposition: A | Discharge status: Home / Self Care | Payer: BC Managed Care – PPO | Source: Home / Self Care | Attending: Family Medicine | Admitting: Family Medicine

## 2019-06-11 ENCOUNTER — Other Ambulatory Visit: Payer: Self-pay

## 2019-06-11 ENCOUNTER — Encounter: Payer: Self-pay | Admitting: Emergency Medicine

## 2019-06-11 DIAGNOSIS — Z20822 Contact with and (suspected) exposure to covid-19: Secondary | ICD-10-CM

## 2019-06-11 DIAGNOSIS — Z20828 Contact with and (suspected) exposure to other viral communicable diseases: Secondary | ICD-10-CM

## 2019-06-11 NOTE — ED Provider Notes (Signed)
[facility]    CSN: [id number] Arrival date & time: 06/11/19  1002      History   Chief Complaint Chief Complaint  Patient presents with  . Covid test    HPI Regina Osborn is a 58 y.o. female.   Patient requests a COVID19 test, having been with friends who had tested positive for [place].  She is completely assymptomatic at present.  The history is provided by the patient.    Past Medical History:  Diagnosis Date  . Anxiety     There are no problems to display for this patient.   Past Surgical History:  Procedure Laterality Date  . REPLACEMENT TOTAL KNEE    . TONSILLECTOMY      OB History   No obstetric history on file.      Home Medications    Prior to Admission medications   Medication Sig Start Date End Date Taking? Authorizing Provider  lisinopril (ZESTRIL) 10 MG tablet Take 10 mg by mouth daily.   Yes [provider]  estradiol (ESTRACE) 1 MG tablet Take 1 mg by mouth daily.    [provider]  progesterone (PROMETRIUM) 100 MG capsule Take 100 mg by mouth daily.    [provider]  thyroid (ARMOUR) 195 MG tablet Take 195 mg by mouth daily.    [provider]    Family History Family History  Problem Relation Age of Onset  . Diabetes Mother   . Neuropathy Mother   . Stroke Father   . Hypertension Father   . Hyperlipidemia Father     Social History Social History   Tobacco Use  . Smoking status: Never Smoker  . Smokeless tobacco: Never Used  Substance Use Topics  . Alcohol use: Yes    Comment: soical   . Drug use: Never     Allergies   Sulfa antibiotics   Review of Systems Review of Systems No sore throat No cough No pleuritic pain No wheezing No nasal congestion No post-nasal drainage No sinus pain/pressure No itchy/red eyes No earache No hemoptysis No SOB No fever/chills No nausea No vomiting No abdominal pain No diarrhea No urinary symptoms No skin rash No  fatigue No myalgias No headache   Physical Exam Triage Vital Signs ED Triage Vitals  Enc Vitals Group     BP 06/11/19 1052 (!) 174/96     Pulse Rate 06/11/19 1052 65     Resp --      Temp 06/11/19 1052 98 F (36.7 C)     Temp Source 06/11/19 1052 Oral     SpO2 06/11/19 1052 100 %     Weight 06/11/19 1053 130 lb (59 kg)     Height 06/11/19 1053 5\' 2"  (1.575 m)     Head Circumference --      Peak Flow --      Pain Score 06/11/19 1053 0     Pain Loc --      Pain Edu? --      Excl. in [place]? --    No data found.  Updated Vital Signs BP (!) 174/96 (BP Location: Right Arm)   Pulse 65   Temp 98 F (36.7 C) (Oral)   Ht 5\' 2"  (1.575 m)   Wt 59 kg   SpO2 100%   BMI 23.78 kg/m   Visual Acuity Right Eye Distance:   Left Eye Distance:   Bilateral Distance:    Right Eye Near:   Left Eye Near:  Bilateral Near:     Physical Exam Vitals and nursing note reviewed.  Constitutional:      General: She is not in acute distress.    Appearance: She is not ill-appearing.  Neurological:     Mental Status: She is alert.   Patient not examined otherwise.   UC Treatments / Results  Labs (all labs ordered are listed, but only abnormal results are displayed) Labs Reviewed  NOVEL CORONAVIRUS, NAA    EKG   Radiology No results found.  Procedures Procedures (including critical care time)  Medications Ordered in UC Medications - No data to display  Initial Impression / Assessment and Plan / UC Course  I have reviewed the triage vital signs and the nursing notes.  Pertinent labs & imaging results that were available during my care of the patient were reviewed by me and considered in my medical decision making (see chart for details).     Patient completely assymptomatic at present. COVID19 send out   Final Clinical Impressions(s) / UC Diagnoses   Final diagnoses:  Exposure to COVID-19 virus     Discharge Instructions     Isolate yourself until COVID-19 test  result is available.   If your COVID19 test is positive, then you are infected with the novel coronavirus and could give the virus to others.  Please continue isolation at home for at least 10 days since the start of your symptoms. If you do not have symptoms, please isolate at home for 10 days from the day you were tested. Once you complete your 10 day quarantine, you may return to normal activities as long as you've not had a fever for over 24 hours (without taking fever reducing medicine) and your symptoms are improving. Please continue good preventive care measures, including:  frequent hand-washing, avoid touching your face, cover coughs/sneezes, stay out of crowds and keep a 6 foot distance from others.  Go to the nearest hospital emergency room if fever/cough/breathlessness are severe or illness seems like a threat to life.     ED Prescriptions    None        Lattie Haw, MD 06/11/19 2005

## 2019-06-11 NOTE — ED Triage Notes (Signed)
Friends tested Positive yesterday, she had been with them 7 days ago. Patient is asymptomatic

## 2019-06-11 NOTE — Discharge Instructions (Addendum)
Isolate yourself until COVID-19 test result is available.   If your COVID19 test is positive, then you are infected with the novel coronavirus and could give the virus to others.  Please continue isolation at home for at least 10 days since the start of your symptoms. If you do not have symptoms, please isolate at home for 10 days from the day you were tested. Once you complete your 10 day quarantine, you may return to normal activities as long as you've not had a fever for over 24 hours (without taking fever reducing medicine) and your symptoms are improving. Please continue good preventive care measures, including:  frequent hand-washing, avoid touching your face, cover coughs/sneezes, stay out of crowds and keep a 6 foot distance from others.  Go to the nearest hospital emergency room if fever/cough/breathlessness are severe or illness seems like a threat to life.  

## 2019-06-14 LAB — NOVEL CORONAVIRUS, NAA: SARS-CoV-2, NAA: NOT DETECTED

## 2019-12-28 ENCOUNTER — Other Ambulatory Visit: Payer: Self-pay

## 2019-12-28 ENCOUNTER — Encounter (HOSPITAL_BASED_OUTPATIENT_CLINIC_OR_DEPARTMENT_OTHER): Payer: Self-pay | Admitting: Emergency Medicine

## 2019-12-28 ENCOUNTER — Emergency Department (HOSPITAL_BASED_OUTPATIENT_CLINIC_OR_DEPARTMENT_OTHER)
Admission: EM | Admit: 2019-12-28 | Discharge: 2019-12-28 | Disposition: A | Discharge status: Home / Self Care | Payer: BC Managed Care – PPO | Attending: Emergency Medicine | Admitting: Emergency Medicine

## 2019-12-28 ENCOUNTER — Emergency Department (HOSPITAL_BASED_OUTPATIENT_CLINIC_OR_DEPARTMENT_OTHER): Payer: BC Managed Care – PPO

## 2019-12-28 DIAGNOSIS — K5792 Diverticulitis of intestine, part unspecified, without perforation or abscess without bleeding: Secondary | ICD-10-CM | POA: Diagnosis not present

## 2019-12-28 DIAGNOSIS — R1031 Right lower quadrant pain: Secondary | ICD-10-CM | POA: Diagnosis present

## 2019-12-28 LAB — URINALYSIS, ROUTINE W REFLEX MICROSCOPIC
Bilirubin Urine: NEGATIVE
Glucose, UA: NEGATIVE mg/dL
Ketones, ur: 15 mg/dL — AB
Nitrite: NEGATIVE
Protein, ur: NEGATIVE mg/dL
Specific Gravity, Urine: 1.015 (ref 1.005–1.030)
pH: 6.5 (ref 5.0–8.0)

## 2019-12-28 LAB — CBC WITH DIFFERENTIAL/PLATELET
Abs Immature Granulocytes: 0.07 10*3/uL (ref 0.00–0.07)
Basophils Absolute: 0.1 10*3/uL (ref 0.0–0.1)
Basophils Relative: 0 %
Eosinophils Absolute: 0 10*3/uL (ref 0.0–0.5)
Eosinophils Relative: 0 %
HCT: 40.1 % (ref 36.0–46.0)
Hemoglobin: 13.8 g/dL (ref 12.0–15.0)
Immature Granulocytes: 1 %
Lymphocytes Relative: 7 %
Lymphs Abs: 1 10*3/uL (ref 0.7–4.0)
MCH: 32.9 pg (ref 26.0–34.0)
MCHC: 34.4 g/dL (ref 30.0–36.0)
MCV: 95.7 fL (ref 80.0–100.0)
Monocytes Absolute: 0.8 10*3/uL (ref 0.1–1.0)
Monocytes Relative: 6 %
Neutro Abs: 11.5 10*3/uL — ABNORMAL HIGH (ref 1.7–7.7)
Neutrophils Relative %: 86 %
Platelets: 258 10*3/uL (ref 150–400)
RBC: 4.19 MIL/uL (ref 3.87–5.11)
RDW: 12.2 % (ref 11.5–15.5)
WBC: 13.4 10*3/uL — ABNORMAL HIGH (ref 4.0–10.5)
nRBC: 0 % (ref 0.0–0.2)

## 2019-12-28 LAB — COMPREHENSIVE METABOLIC PANEL
ALT: 13 U/L (ref 0–44)
AST: 17 U/L (ref 15–41)
Albumin: 4.1 g/dL (ref 3.5–5.0)
Alkaline Phosphatase: 71 U/L (ref 38–126)
Anion gap: 10 (ref 5–15)
BUN: 10 mg/dL (ref 6–20)
CO2: 26 mmol/L (ref 22–32)
Calcium: 8.9 mg/dL (ref 8.9–10.3)
Chloride: 102 mmol/L (ref 98–111)
Creatinine, Ser: 0.55 mg/dL (ref 0.44–1.00)
GFR calc Af Amer: >60 mL/min (ref >60)
GFR calc non Af Amer: >60 mL/min (ref >60)
Glucose, Bld: 123 mg/dL — ABNORMAL HIGH (ref 70–99)
Potassium: 3.5 mmol/L (ref 3.5–5.1)
Sodium: 138 mmol/L (ref 135–145)
Total Bilirubin: 1.2 mg/dL (ref 0.3–1.2)
Total Protein: 7.3 g/dL (ref 6.5–8.1)

## 2019-12-28 LAB — URINALYSIS, MICROSCOPIC (REFLEX)

## 2019-12-28 LAB — LIPASE, BLOOD: Lipase: 27 U/L (ref 11–51)

## 2019-12-28 MED ORDER — SODIUM CHLORIDE 0.9 % IV BOLUS
1000.0000 mL | Freq: Once | INTRAVENOUS | Status: AC
  Administered 2019-12-28: 1000 mL via INTRAVENOUS

## 2019-12-28 MED ORDER — IOHEXOL 300 MG/ML  SOLN
100.0000 mL | Freq: Once | INTRAMUSCULAR | Status: AC | PRN
  Administered 2019-12-28: 100 mL via INTRAVENOUS

## 2019-12-28 MED ORDER — HYDROXYZINE HCL 25 MG PO TABS
25.0000 mg | ORAL_TABLET | Freq: Three times a day (TID) | ORAL | 0 refills | Status: AC | PRN
Start: 2019-12-28 — End: ?

## 2019-12-28 MED ORDER — CIPROFLOXACIN HCL 500 MG PO TABS
500.0000 mg | ORAL_TABLET | Freq: Two times a day (BID) | ORAL | 0 refills | Status: AC
Start: 2019-12-28 — End: 2020-01-07

## 2019-12-28 MED ORDER — METRONIDAZOLE 500 MG PO TABS: 500.0000 mg | ORAL_TABLET | Freq: Two times a day (BID) | ORAL | 0 refills | Status: AC

## 2019-12-28 MED ORDER — HYDROXYZINE HCL 25 MG PO TABS
25.0000 mg | ORAL_TABLET | Freq: Once | ORAL | Status: AC
  Administered 2019-12-28: 25 mg via ORAL
  Filled 2019-12-28: qty 1

## 2019-12-28 MED ORDER — MORPHINE SULFATE (PF) 2 MG/ML IV SOLN
2.0000 mg | Freq: Once | INTRAVENOUS | Status: AC
  Administered 2019-12-28: 2 mg via INTRAVENOUS
  Filled 2019-12-28: qty 1

## 2019-12-28 NOTE — ED Provider Notes (Signed)
[facility] EMERGENCY DEPARTMENT Provider Note   CSN: [id number] Arrival date & time: 12/28/19  1120     History Chief Complaint  Patient presents with  . Abdominal Pain    Regina Osborn is a 59 y.o. female with past medical history notable for diverticulitis and anxiety presents the ED with acute onset right lower quadrant abdominal pain.  Patient reports that she was seen at an urgent care earlier this morning who subsequently referred her here for evaluation.  She states that she was experiencing periumbilical/suprapubic abdominal discomfort yesterday that has since been waxing and waning in character.  She is also had a couple episodes of loose stools and her collection of symptoms were consistent with her episodes of diverticulitis in the past.  However, her pain has since localized to her RLQ which prompted concern for appendicitis.  She endorses some low-grade fevers while at home and intermittent nausea symptoms, but none at this time.  Her appetite remains intact.  She has not eaten anything today, but is hungry.  She last consumed water/coffee approximately 1 hour prior to arrival.  Of note, she states that her anxiety has been significant over the course of the past couple of weeks which has been a trigger for her diverticulitis episodes in the past.  She states that her father is very ill and she recently followed up with her primary care provider to discuss possible anxiety medications, but does not wish to proceed with the recommended SSRI at this time.  HPI     Past Medical History:  Diagnosis Date  . Anxiety     There are no problems to display for this patient.   Past Surgical History:  Procedure Laterality Date  . REPLACEMENT TOTAL KNEE    . TONSILLECTOMY       OB History   No obstetric history on file.     Family History  Problem Relation Age of Onset  . Diabetes Mother   . Neuropathy Mother   . Stroke Father   . Hypertension Father   .  Hyperlipidemia Father     Social History   Tobacco Use  . Smoking status: Never Smoker  . Smokeless tobacco: Never Used  Vaping Use  . Vaping Use: Never used  Substance Use Topics  . Alcohol use: Yes    Comment: social  . Drug use: Never    Home Medications Prior to Admission medications   Medication Sig Start Date End Date Taking? Authorizing Provider  estradiol (ESTRACE) 1 MG tablet Take 1 mg by mouth daily.    [provider]  lisinopril (ZESTRIL) 10 MG tablet Take 10 mg by mouth daily.    [provider]  progesterone (PROMETRIUM) 100 MG capsule Take 100 mg by mouth daily.    [provider]  thyroid (ARMOUR) 195 MG tablet Take 195 mg by mouth daily.    [provider]    Allergies    Sulfa antibiotics  Review of Systems   Review of Systems  All other systems reviewed and are negative.   Physical Exam Updated Vital Signs BP (!) 138/94 (BP Location: Left Arm)   Pulse 80   Temp 99.1 F (37.3 C) (Oral)   Resp 18   Ht 5\' 3"  (1.6 m)   Wt 59 kg   SpO2 100%   BMI 23.03 kg/m   Physical Exam Vitals and nursing note reviewed. Exam conducted with a chaperone present.  Constitutional:      Appearance:  Normal appearance.  HENT:     Head: Normocephalic and atraumatic.  Eyes:     General: No scleral icterus.    Conjunctiva/sclera: Conjunctivae normal.  Cardiovascular:     Rate and Rhythm: Normal rate and regular rhythm.     Pulses: Normal pulses.     Heart sounds: Normal heart sounds.  Pulmonary:     Effort: Pulmonary effort is normal. No respiratory distress.     Breath sounds: Normal breath sounds.  Abdominal:     Comments: Soft, nondistended.  Negative Rovsing sign.  TTP appreciated in RLQ.  McBurney point tenderness.  TTP also noted in suprapubic/periumbilical region.  No significant TTP elsewhere.  No guarding.  No overlying skin changes.  Musculoskeletal:     Cervical back: Normal range of motion and neck supple. No  rigidity.  Skin:    General: Skin is dry.  Neurological:     Mental Status: She is alert.     GCS: GCS eye subscore is 4. GCS verbal subscore is 5. GCS motor subscore is 6.  Psychiatric:        Mood and Affect: Mood normal.        Behavior: Behavior normal.        Thought Content: Thought content normal.     ED Results / Procedures / Treatments   Labs (all labs ordered are listed, but only abnormal results are displayed) Labs Reviewed  URINALYSIS, ROUTINE W REFLEX MICROSCOPIC - Abnormal; Notable for the following components:      Result Value   Hgb urine dipstick TRACE (*)    Ketones, ur 15 (*)    Leukocytes,Ua SMALL (*)    All other components within normal limits  CBC WITH DIFFERENTIAL/PLATELET - Abnormal; Notable for the following components:   WBC 13.4 (*)    Neutro Abs 11.5 (*)    All other components within normal limits  COMPREHENSIVE METABOLIC PANEL - Abnormal; Notable for the following components:   Glucose, Bld 123 (*)    All other components within normal limits  URINALYSIS, MICROSCOPIC (REFLEX) - Abnormal; Notable for the following components:   Bacteria, UA MANY (*)    All other components within normal limits  LIPASE, BLOOD    EKG None  Radiology CT ABDOMEN PELVIS W CONTRAST  Result Date: 12/28/2019 CLINICAL DATA:  Abdominal pain EXAM: CT ABDOMEN AND PELVIS WITH CONTRAST TECHNIQUE: Multidetector CT imaging of the abdomen and pelvis was performed using the standard protocol following bolus administration of intravenous contrast. CONTRAST:  100mL OMNIPAQUE IOHEXOL 300 MG/ML  SOLN COMPARISON:  Report from CT abdomen pelvis dated 06/24/2010. FINDINGS: Lower chest: There is mild bibasilar atelectasis/scarring. Hepatobiliary: A 6 mm hypoattenuating lesion in hepatic segment 4A is too small to characterize but is likely benign in the absence of known liver disease or malignancy. No gallstones, gallbladder wall thickening, or biliary dilatation. Pancreas: Unremarkable.  No pancreatic ductal dilatation or surrounding inflammatory changes. Spleen: Normal in size without focal abnormality. Adrenals/Urinary Tract: Adrenal glands are unremarkable. A 5 mm hypoattenuating lesion in the mid zone of the left kidney is too small to characterize, but likely represents a cyst. Otherwise, the kidneys are normal, without renal calculi, focal lesion, or hydronephrosis. Bladder is unremarkable. Stomach/Bowel: Stomach is within normal limits. No pericecal inflammatory changes are noted to suggest acute appendicitis. There are diverticula in the sigmoid colon with bowel wall thickening and surrounding fat stranding, consistent with acute diverticulitis. No definite well-defined fluid collection to suggest abscess is noted. Diverticula are also  noted throughout the colon. No evidence of bowel obstruction. Vascular/Lymphatic: No significant vascular findings are present. No enlarged abdominal or pelvic lymph nodes. Reproductive: Hypoattenuation and indistinctness of the cervix is noted (series 2, image 60 and series 6, image 59). There is vascular congestion of the vessels draining the parametrium. Other: No abdominal wall hernia or abnormality. No abdominopelvic ascites. Musculoskeletal: Degenerative changes in the spine are most significant at L2-L3 and L5-S1. IMPRESSION: 1. Acute sigmoid diverticulitis without evidence of complication. Colonoscopy should be considered after the patient's acute process resolves. 2. Hypoattenuation and indistinctness of the cervix may reflect reactive inflammation related to the patient's diverticulitis. Recommend pelvic exam correlation once the patient's acute process resolves. Electronically Signed   By: Romona Curls M.D.   On: 12/28/2019 14:04    Procedures Procedures (including critical care time)  Medications Ordered in ED Medications  sodium chloride 0.9 % bolus 1,000 mL (1,000 mLs Intravenous New Bag/Given 12/28/19 1252)  morphine 2 MG/ML injection 2  mg (2 mg Intravenous Given 12/28/19 1253)  hydrOXYzine (ATARAX/VISTARIL) tablet 25 mg (25 mg Oral Given 12/28/19 1253)  iohexol (OMNIPAQUE) 300 MG/ML solution 100 mL (100 mLs Intravenous Contrast Given 12/28/19 1300)    ED Course  I have reviewed the triage vital signs and the nursing notes.  Pertinent labs & imaging results that were available during my care of the patient were reviewed by me and considered in my medical decision making (see chart for details).    MDM Rules/Calculators/A&P                          Patient's history and physical exam is concerning for appendicitis versus diverticulitis.  Labs CBC: Leukocytosis with WBC elevated to 13.4. CMP: Unremarkable. Lipase: WNL. UA: Trace hemoglobin, small leukocytes, 15 ketones.  Relatively unremarkable.  Imaging CT abdomen pelvis with contrast: I personally reviewed imaging which demonstrates evidence of uncomplicated acute sigmoid diverticulitis.  No perforation or abscess appreciated.  Patient is tolerating food and liquids without difficulty and is in no significant pain discomfort.  I did treat her pain symptoms with 2 mg morphine and she states that she feels much improved.  She is reasonable for discharge at this time.  She will need to have repeat colonoscopy, which she tells me she already has planned.  I also notified her of reactive inflammation involving cervix and she reports annual GYN exams.  Patient to be discharged home with metronidazole and ciprofloxacin.  Patient also states that she been having difficulty sleeping at night due to her profound anxiety related to her dad's illness, we will prescribe her a short course of hydroxyzine to take as needed.    All of the evaluation and work-up results were discussed with the patient and any family at bedside.  Patient and/or family were informed that while patient is appropriate for discharge at this time, some medical emergencies may only develop or become detectable after a  period of time.  I specifically instructed patient and/or family to return to return to the ED or seek immediate medical attention for any new or worsening symptoms.  They were provided opportunity to ask any additional questions and have none at this time.  Prior to discharge patient is feeling well, agreeable with plan for discharge home.  They have expressed understanding of verbal discharge instructions as well as return precautions and are agreeable to the plan.    Final Clinical Impression(s) / ED Diagnoses Final diagnoses:  None  Rx / DC Orders ED Discharge Orders    None       Elvera Maria 12/28/19 1446    Alvira Monday, MD 12/30/19 2322

## 2019-12-28 NOTE — ED Notes (Signed)
Pt unable to void, given specimen cup. States she just went to the restroom recently.

## 2019-12-28 NOTE — Discharge Instructions (Signed)
Please take antibiotics, as directed.  You may take Tylenol or ibuprofen as needed for symptoms of discomfort.  I have also prescribed you Vistaril/Atarax that you can take for your difficulty sleeping related to anxiety.  I hope that your father gets better.  Please take this medication before going to bed.  It will make you drowsy.  Do not drive or operate machinery after taking this medication.  Please follow-up with your primary care provider regarding today's encounter.  You will need to have the colonoscopy performed, per our discussion.  Return to the ED or seek immediate medical attention should you experience any new or worsening symptoms.

## 2019-12-28 NOTE — ED Triage Notes (Signed)
Pt arrives pov c/o acute onset RLQ pain with radiation to right lower back that pt reports started last night. Pt reports lower abdominal pain with diarrhea that started today. PT denies dysuria, denies fever. Pt also reports nausea

## 2021-08-03 IMAGING — CT CT ABD-PELV W/ CM
2 of 5 series · 16 of 46 positions shown, 18 images · IV contrast (Omnipaque)
Comparison: Report from CT abdomen pelvis dated 06/24/2010.

CLINICAL DATA: Abdominal pain

EXAM:
CT ABDOMEN AND PELVIS WITH CONTRAST
TECHNIQUE: Multidetector CT imaging of the abdomen and pelvis was performed
using the standard protocol following bolus administration of
intravenous contrast.
CONTRAST:  100mL OMNIPAQUE IOHEXOL 300 MG/ML  SOLN

[Series 2: axial st · axial · 0.96mm/px · z∈[-368,-28]mm · 13 of 78 slices shown, 15 images]
[im 5/78  soft-tissue]
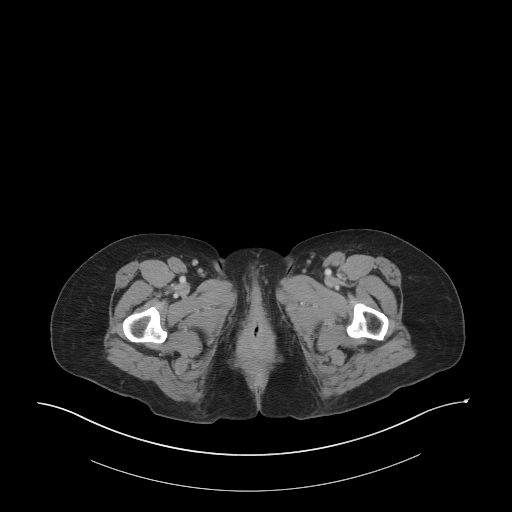
[im 5/78  bone]
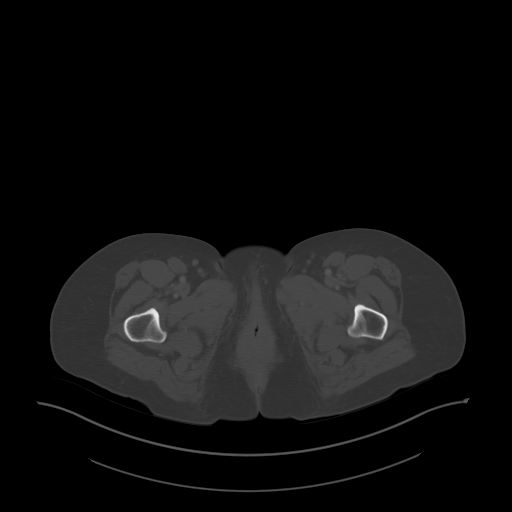
[im 10/78  soft-tissue]
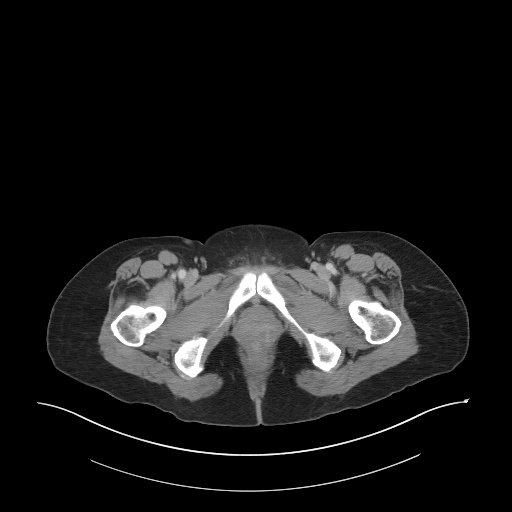
[im 19/78  soft-tissue]
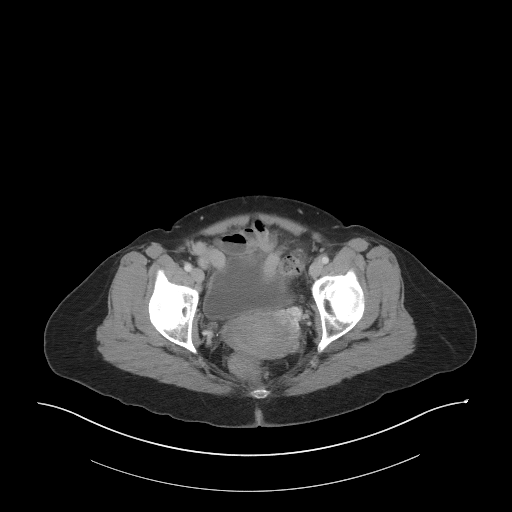
[im 23/78  soft-tissue]
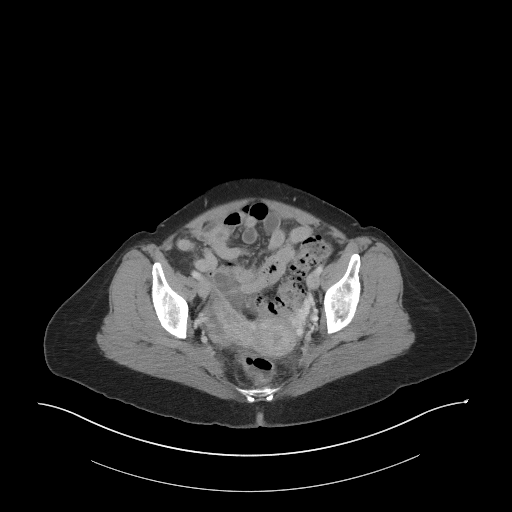
[im 28/78  soft-tissue]
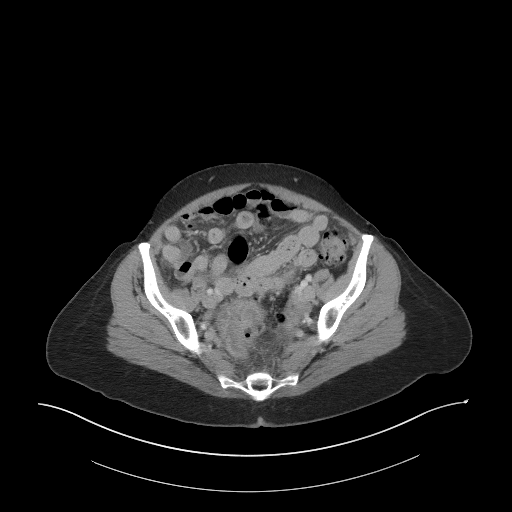
[im 32/78  soft-tissue]
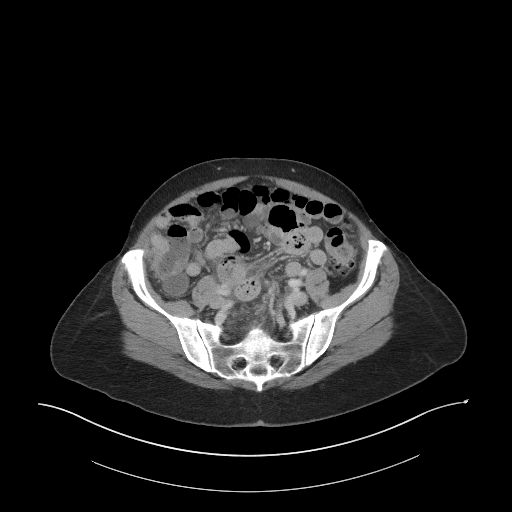
[im 41/78  soft-tissue]
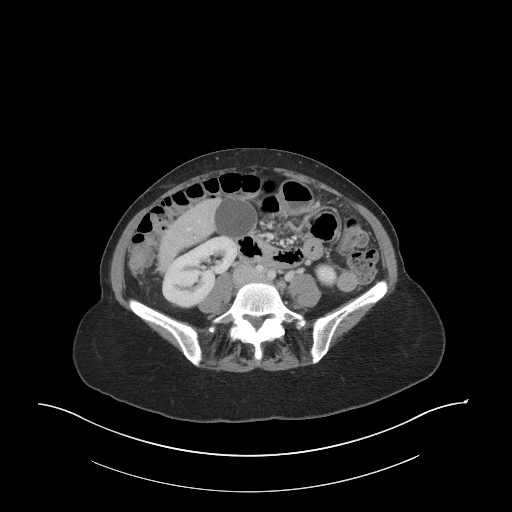
[im 46/78  soft-tissue]
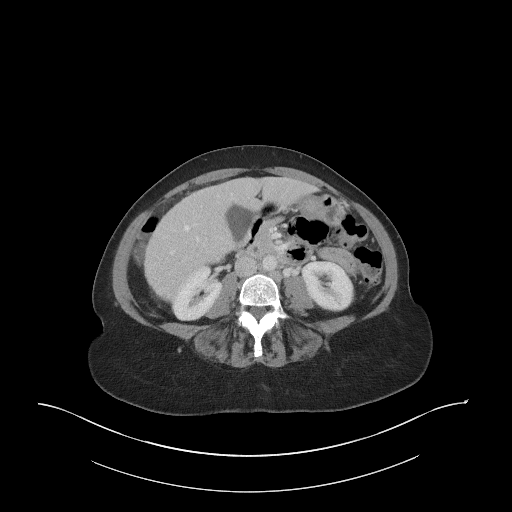
[im 50/78  soft-tissue]
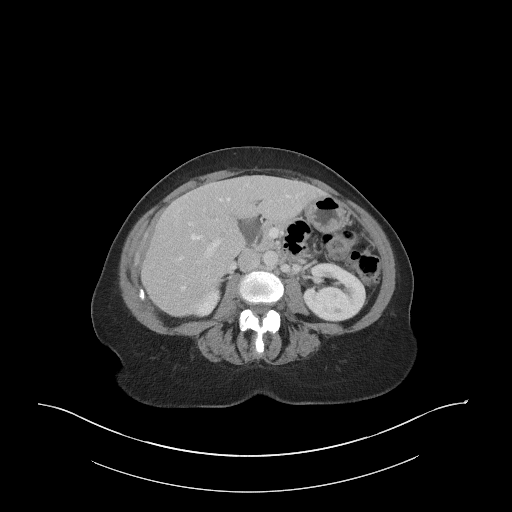
[im 50/78  bone]
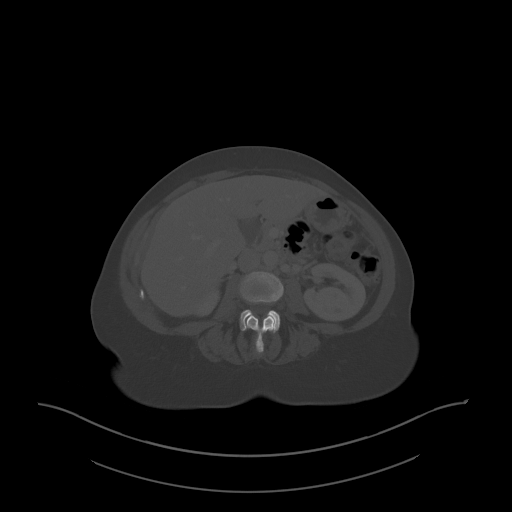
[im 55/78  soft-tissue]
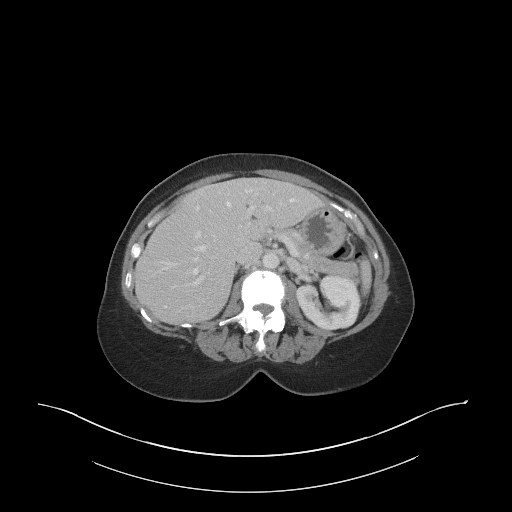
[im 59/78  soft-tissue]
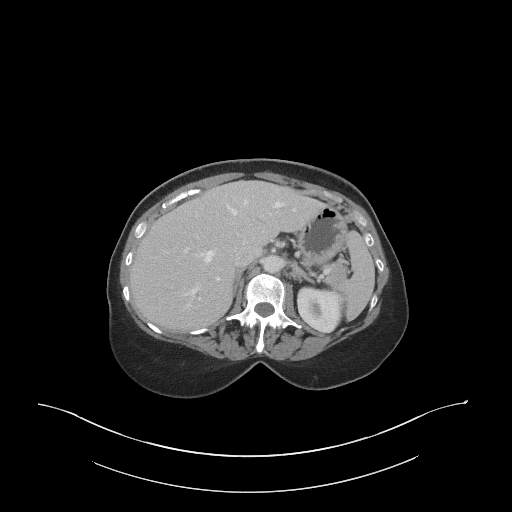
[im 68/78  soft-tissue]
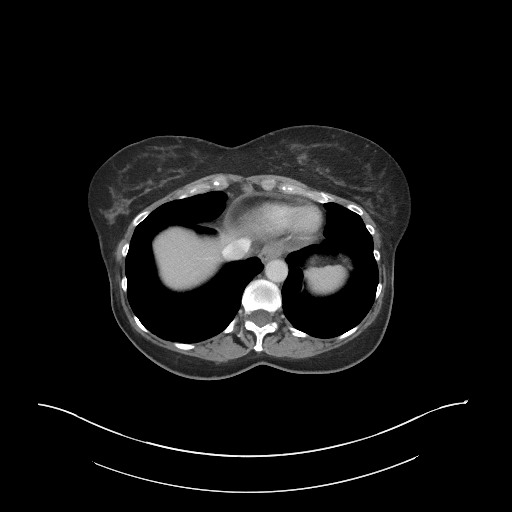
[im 73/78  soft-tissue]
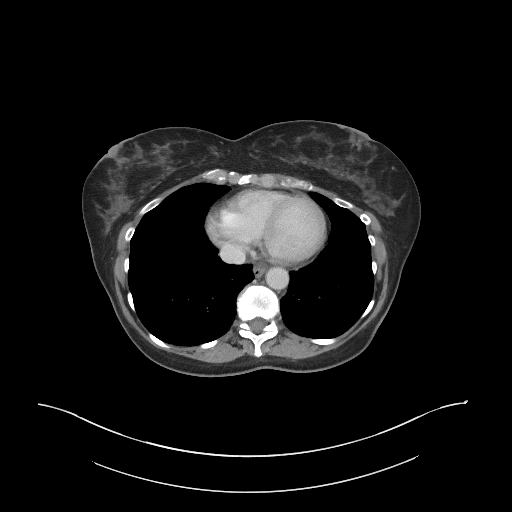

[Series 5: coronal st · coronal · 0.71mm/px · 3 of 87 slices shown]
[im 29/87  soft-tissue]
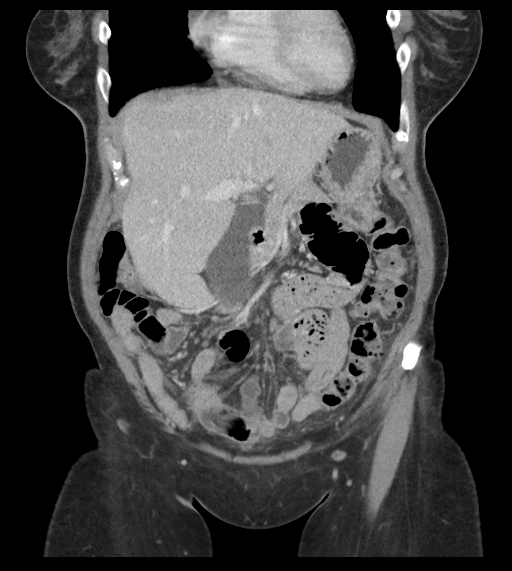
[im 39/87  soft-tissue]
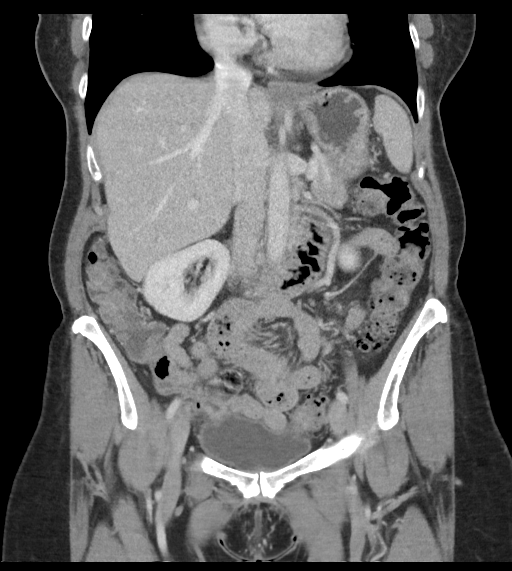
[im 48/87  soft-tissue]
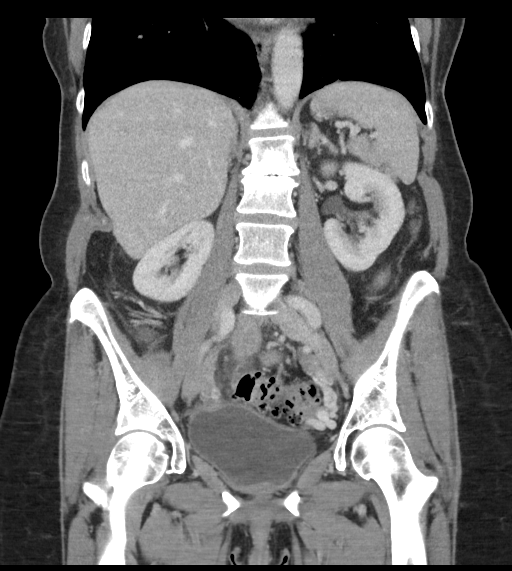

[16 of 46 positions shown; findings below may reference images not displayed]

FINDINGS: Lower chest: There is mild bibasilar atelectasis/scarring.

Hepatobiliary: A 6 mm hypoattenuating lesion in hepatic segment 4A
is too small to characterize but is likely benign in the absence of
known liver disease or malignancy. No gallstones, gallbladder wall
thickening, or biliary dilatation.

Pancreas: Unremarkable. No pancreatic ductal dilatation or
surrounding inflammatory changes.

Spleen: Normal in size without focal abnormality.

Adrenals/Urinary Tract: Adrenal glands are unremarkable. A 5 mm
hypoattenuating lesion in the mid zone of the left kidney is too
small to characterize, but likely represents a cyst. Otherwise, the
kidneys are normal, without renal calculi, focal lesion, or
hydronephrosis. Bladder is unremarkable.

Stomach/Bowel: Stomach is within normal limits. No pericecal
inflammatory changes are noted to suggest acute appendicitis. There
are diverticula in the sigmoid colon with bowel wall thickening and
surrounding fat stranding, consistent with acute diverticulitis. No
definite well-defined fluid collection to suggest abscess is noted.
Diverticula are also noted throughout the colon. No evidence of
bowel obstruction.

Vascular/Lymphatic: No significant vascular findings are present. No
enlarged abdominal or pelvic lymph nodes.

Reproductive: Hypoattenuation and indistinctness of the cervix is
noted (series 2, image 60 and series 6, image 59). There is vascular
congestion of the vessels draining the parametrium.

Other: No abdominal wall hernia or abnormality. No abdominopelvic
ascites.

Musculoskeletal: Degenerative changes in the spine are most
significant at L2-L3 and L5-S1.
IMPRESSION: 1. Acute sigmoid diverticulitis without evidence of complication.
Colonoscopy should be considered after the patient's acute process
resolves.
2. Hypoattenuation and indistinctness of the cervix may reflect
reactive inflammation related to the patient's diverticulitis.
Recommend pelvic exam correlation once the patient's acute process
resolves.
